# Patient Record
Sex: Female | Born: 1990 | Race: Black or African American | Hispanic: No | Marital: Single | Smoking: Never smoker
Health system: Southern US, Community
[De-identification: ages and names within clinical notes are randomized; demographics above are authoritative.]

---

## 2018-07-15 ENCOUNTER — Emergency Department
Admission: EM | Admit: 2018-07-15 | Discharge: 2018-07-15 | Disposition: A | Discharge status: Home / Self Care | Payer: BC Managed Care – PPO | Attending: Emergency Medicine | Admitting: Emergency Medicine

## 2018-07-15 ENCOUNTER — Emergency Department: Payer: BC Managed Care – PPO

## 2018-07-15 ENCOUNTER — Other Ambulatory Visit: Payer: Self-pay

## 2018-07-15 DIAGNOSIS — W108XXA Fall (on) (from) other stairs and steps, initial encounter: Secondary | ICD-10-CM | POA: Insufficient documentation

## 2018-07-15 DIAGNOSIS — Y929 Unspecified place or not applicable: Secondary | ICD-10-CM | POA: Diagnosis not present

## 2018-07-15 DIAGNOSIS — Y999 Unspecified external cause status: Secondary | ICD-10-CM | POA: Insufficient documentation

## 2018-07-15 DIAGNOSIS — S20222A Contusion of left back wall of thorax, initial encounter: Secondary | ICD-10-CM | POA: Diagnosis not present

## 2018-07-15 DIAGNOSIS — W19XXXA Unspecified fall, initial encounter: Secondary | ICD-10-CM

## 2018-07-15 DIAGNOSIS — Y939 Activity, unspecified: Secondary | ICD-10-CM | POA: Diagnosis not present

## 2018-07-15 DIAGNOSIS — S3992XA Unspecified injury of lower back, initial encounter: Secondary | ICD-10-CM | POA: Diagnosis present

## 2018-07-15 MED ORDER — LIDOCAINE 5 % EX PTCH
1.0000 | MEDICATED_PATCH | CUTANEOUS | Status: DC
  Administered 2018-07-15: 1 via TRANSDERMAL
  Filled 2018-07-15: qty 1

## 2018-07-15 MED ORDER — KETOROLAC TROMETHAMINE 60 MG/2ML IM SOLN
60.0000 mg | Freq: Once | INTRAMUSCULAR | Status: AC
  Administered 2018-07-15: 60 mg via INTRAMUSCULAR
  Filled 2018-07-15: qty 2

## 2018-07-15 MED ORDER — IBUPROFEN 600 MG PO TABS: 600.0000 mg | ORAL_TABLET | Freq: Three times a day (TID) | ORAL | 0 refills | Status: DC | PRN

## 2018-07-15 MED ORDER — CYCLOBENZAPRINE HCL 10 MG PO TABS: 10.0000 mg | ORAL_TABLET | Freq: Three times a day (TID) | ORAL | 0 refills | Status: DC | PRN

## 2018-07-15 MED ORDER — TRAMADOL HCL 50 MG PO TABS: 50.0000 mg | ORAL_TABLET | Freq: Four times a day (QID) | ORAL | 0 refills | Status: AC | PRN

## 2018-07-15 MED ORDER — ORPHENADRINE CITRATE 30 MG/ML IJ SOLN
60.0000 mg | Freq: Two times a day (BID) | INTRAMUSCULAR | Status: DC
  Administered 2018-07-15: 60 mg via INTRAMUSCULAR
  Filled 2018-07-15: qty 2

## 2018-07-15 NOTE — ED Triage Notes (Signed)
Pt states she fell backward down 3 wooden steps this morning and is having pain in the left lower back, denies any other injures at this time.

## 2018-07-15 NOTE — ED Notes (Signed)
See triage note  States she slipped down steps this am  Hitting lower back  Having pain to left lower back

## 2018-07-15 NOTE — Discharge Instructions (Signed)
Discharge care instruction take medication as directed. 

## 2018-07-15 NOTE — ED Provider Notes (Signed)
[facility] Emergency Department Provider Note   ____________________________________________   First MD Initiated Contact with Patient 07/15/18 1052     (approximate)  I have reviewed the triage vital signs and the nursing notes.   HISTORY  Chief Complaint Fall    HPI Ashley Stokes is a 28 y.o. female patient complain of low back pain secondary to falling backwards down 3 wooden steps this morning.  Patient denies radicular component to her l back pain.  Patient denies bladder or bowel dysfunction.  Patient rates the pain as a 6/10.  Patient described pain is "achy".  No palliative measure for complaint.         History reviewed. No pertinent past medical history.  There are no active problems to display for this patient.   History reviewed. No pertinent surgical history.  Prior to Admission medications   Medication Sig Start Date End Date Taking? Authorizing Provider  cyclobenzaprine (FLEXERIL) 10 MG tablet Take 1 tablet (10 mg total) by mouth 3 (three) times daily as needed. 07/15/18   Sable Feil, PA-C  ibuprofen (ADVIL) 600 MG tablet Take 1 tablet (600 mg total) by mouth every 8 (eight) hours as needed. 07/15/18   Sable Feil, PA-C  traMADol (ULTRAM) 50 MG tablet Take 1 tablet (50 mg total) by mouth every 6 (six) hours as needed for up to 3 days. 07/15/18 07/18/18  Sable Feil, PA-C    Allergies Patient has no known allergies.  No family history on file.  Social History Social History   Tobacco Use  . Smoking status: Never Smoker  . Smokeless tobacco: Never Used  Substance Use Topics  . Alcohol use: Yes  . Drug use: Not Currently    Review of Systems Constitutional: No fever/chills Eyes: No visual changes. ENT: No sore throat. Cardiovascular: Denies chest pain. Respiratory: Denies shortness of breath. Gastrointestinal: No abdominal pain.  No nausea, no vomiting.  No diarrhea.  No constipation. Genitourinary: Negative  for dysuria. Musculoskeletal: As of her back pain. Skin: Negative for rash. Neurological: Negative for headaches, focal weakness or numbness.   ____________________________________________   PHYSICAL EXAM:  VITAL SIGNS: ED Triage Vitals  Enc Vitals Group     BP 07/15/18 1048 (!) 138/91     Pulse Rate 07/15/18 1048 91     Resp 07/15/18 1048 17     Temp 07/15/18 1048 98 F (36.7 C)     Temp Source 07/15/18 1048 Oral     SpO2 07/15/18 1048 98 %     Weight 07/15/18 1049 180 lb (81.6 kg)     Height 07/15/18 1049 5\' 3"  (1.6 m)     Head Circumference --      Peak Flow --      Pain Score 07/15/18 1049 6     Pain Loc --      Pain Edu? --      Excl. in [place]? --    Constitutional: Alert and oriented. Well appearing and in no acute distress. Eyes: Conjunctivae are normal. PERRL. EOMI. Head: Atraumatic. Nose: No congestion/rhinnorhea. Mouth/Throat: Mucous membranes are moist.  Oropharynx non-erythematous. Neck: No stridor.  No cervical spine tenderness to palpation. Cardiovascular: Normal rate, regular rhythm. Grossly normal heart sounds.  Good peripheral circulation. Respiratory: Normal respiratory effort.  No retractions. Lungs CTAB. Gastrointestinal: Soft and nontender. No distention. No abdominal bruits. No CVA tenderness. Genitourinary: Deferred Musculoskeletal: No obvious deformities lumbar spine.  Patient decreased range of motion with flexion.  Patient has  moderate guarding palpation L4-S1.  No lower extremity tenderness nor edema.  No joint effusions. Neurologic:  Normal speech and language. No gross focal neurologic deficits are appreciated. No gait instability. Skin:  Skin is warm, dry and intact. No rash noted.  No abrasion or ecchymosis. Psychiatric: Mood and affect are normal. Speech and behavior are normal.  ____________________________________________   LABS (all labs ordered are listed, but only abnormal results are displayed)  Labs Reviewed - No data to display  ____________________________________________  EKG   ____________________________________________  RADIOLOGY  ED MD interpretation:    Official radiology report(s): Dg Lumbar Spine 2-3 Views  Result Date: 07/15/2018 CLINICAL DATA:  Low back injury today when the patient slipped on steps. Initial encounter. EXAM: LUMBAR SPINE - 2-3 VIEW COMPARISON:  None. 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 Exact numbering could be determined with evaluation of plain films of thoracic spine. There is no evidence of lumbar spine fracture. Alignment is normal. Intervertebral disc spaces are maintained. IMPRESSION: No acute abnormality. Electronically Signed   By: Drusilla Kannerhomas  Dalessio M.D.   On: 07/15/2018 12:57    ____________________________________________   PROCEDURES  Procedure(s) performed (including Critical Care):  Procedures   ____________________________________________   INITIAL IMPRESSION / ASSESSMENT AND PLAN / ED COURSE  As part of my medical decision making, I reviewed the following data within the electronic MEDICAL RECORD NUMBER         Patient presents with low back pain secondary to fall.  Discussed negative x-ray findings with patient.  Patient given discharge care instructions and advised take medication as directed.  Patient advised return if condition worsens.      ____________________________________________   FINAL CLINICAL IMPRESSION(S) / ED DIAGNOSES  Final diagnoses:  Fall, initial encounter  Back contusion, left, initial encounter     ED Discharge Orders         Ordered    traMADol (ULTRAM) 50 MG tablet  Every 6 hours PRN     07/15/18 1325    cyclobenzaprine (FLEXERIL) 10 MG tablet  3 times daily PRN     07/15/18 1325    ibuprofen (ADVIL) 600 MG tablet  Every 8 hours PRN     07/15/18 1325           Note:  This document was  prepared using Dragon voice recognition software and may include unintentional dictation errors.    Joni ReiningSmith, Mellony Danziger K, PA-C 07/15/18 1329    Shaune PollackIsaacs, [place], MD 07/15/18 1330

## 2020-04-18 IMAGING — CR LUMBAR SPINE - 2-3 VIEW
1 series · 3 of 3 positions shown · non-contrast
Comparison: None.

CLINICAL DATA: Low back injury today when the patient slipped on
steps. Initial encounter.

EXAM:
LUMBAR SPINE - 2-3 VIEW

[Series 1: dg lumbar spine 2-3 views · 0.14mm/px · 3 of 3 slices shown]
[im 1/3]
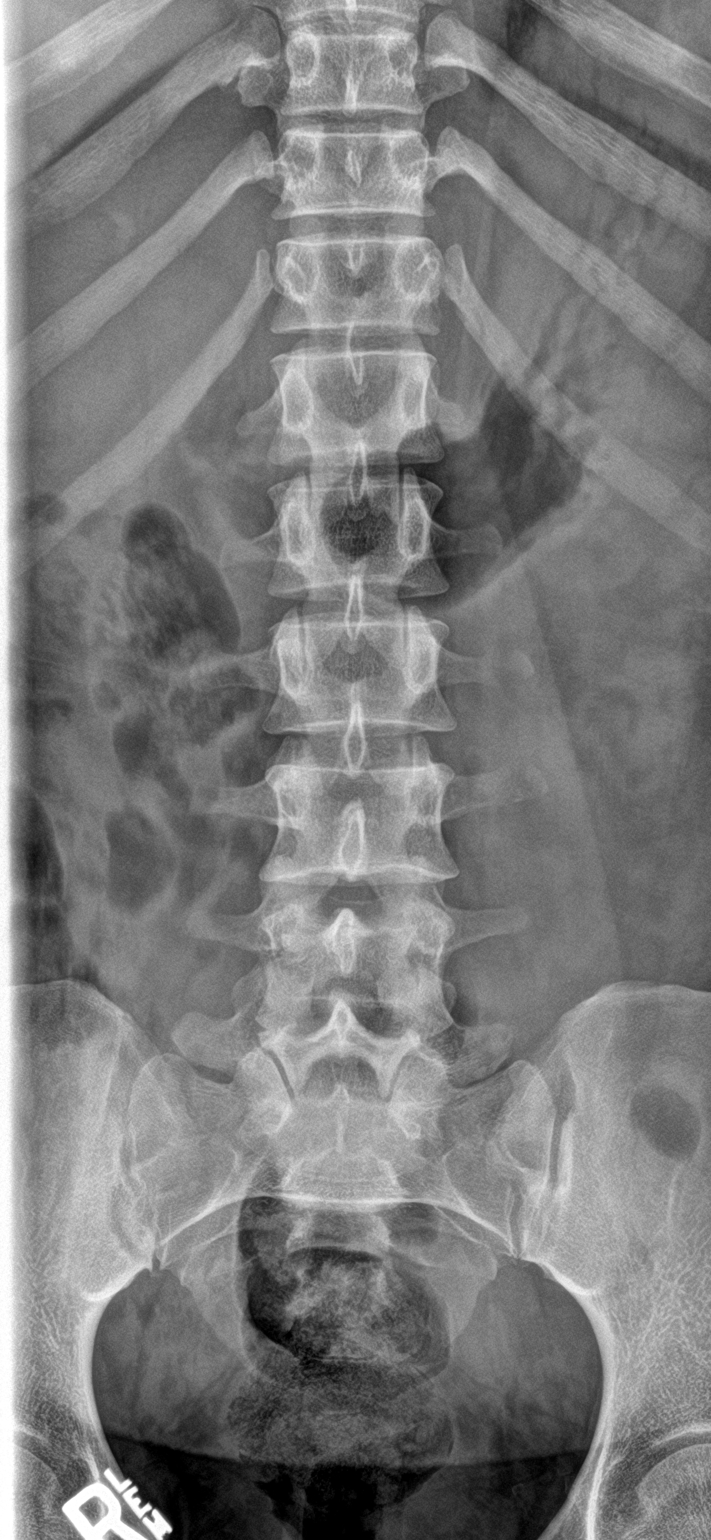
[im 2/3]
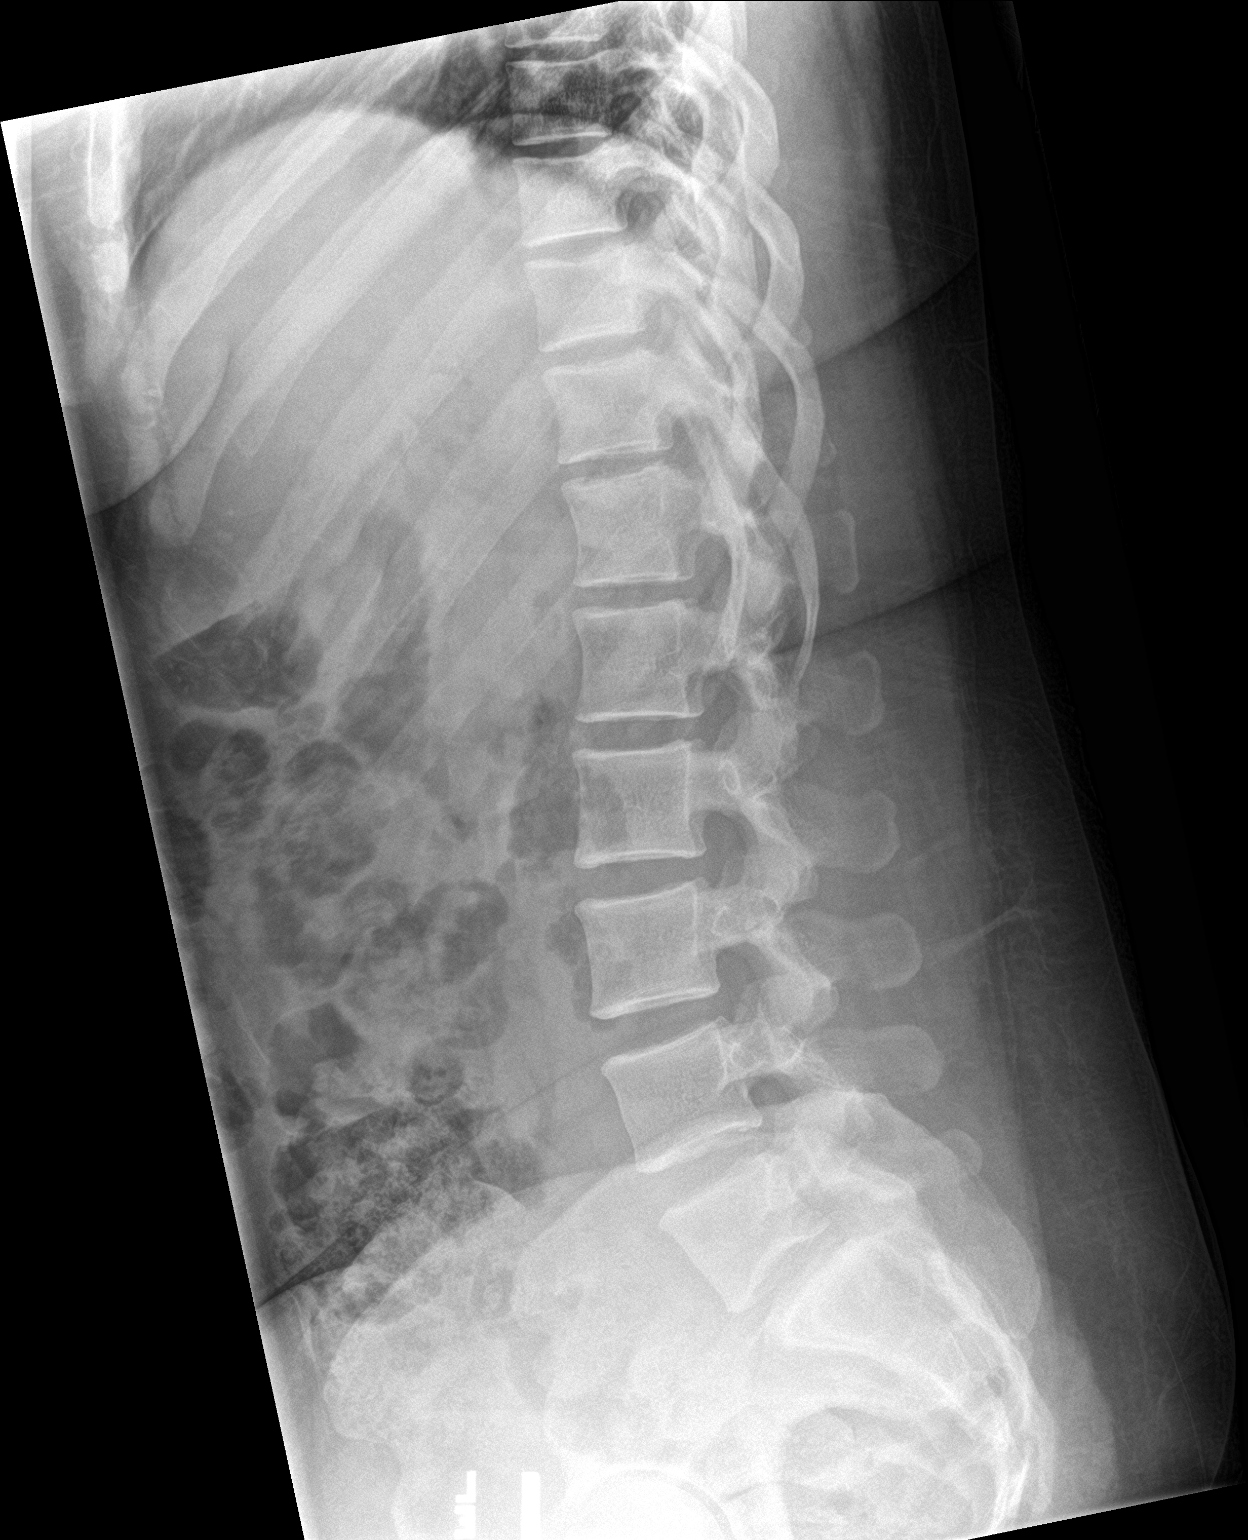
[im 3/3]
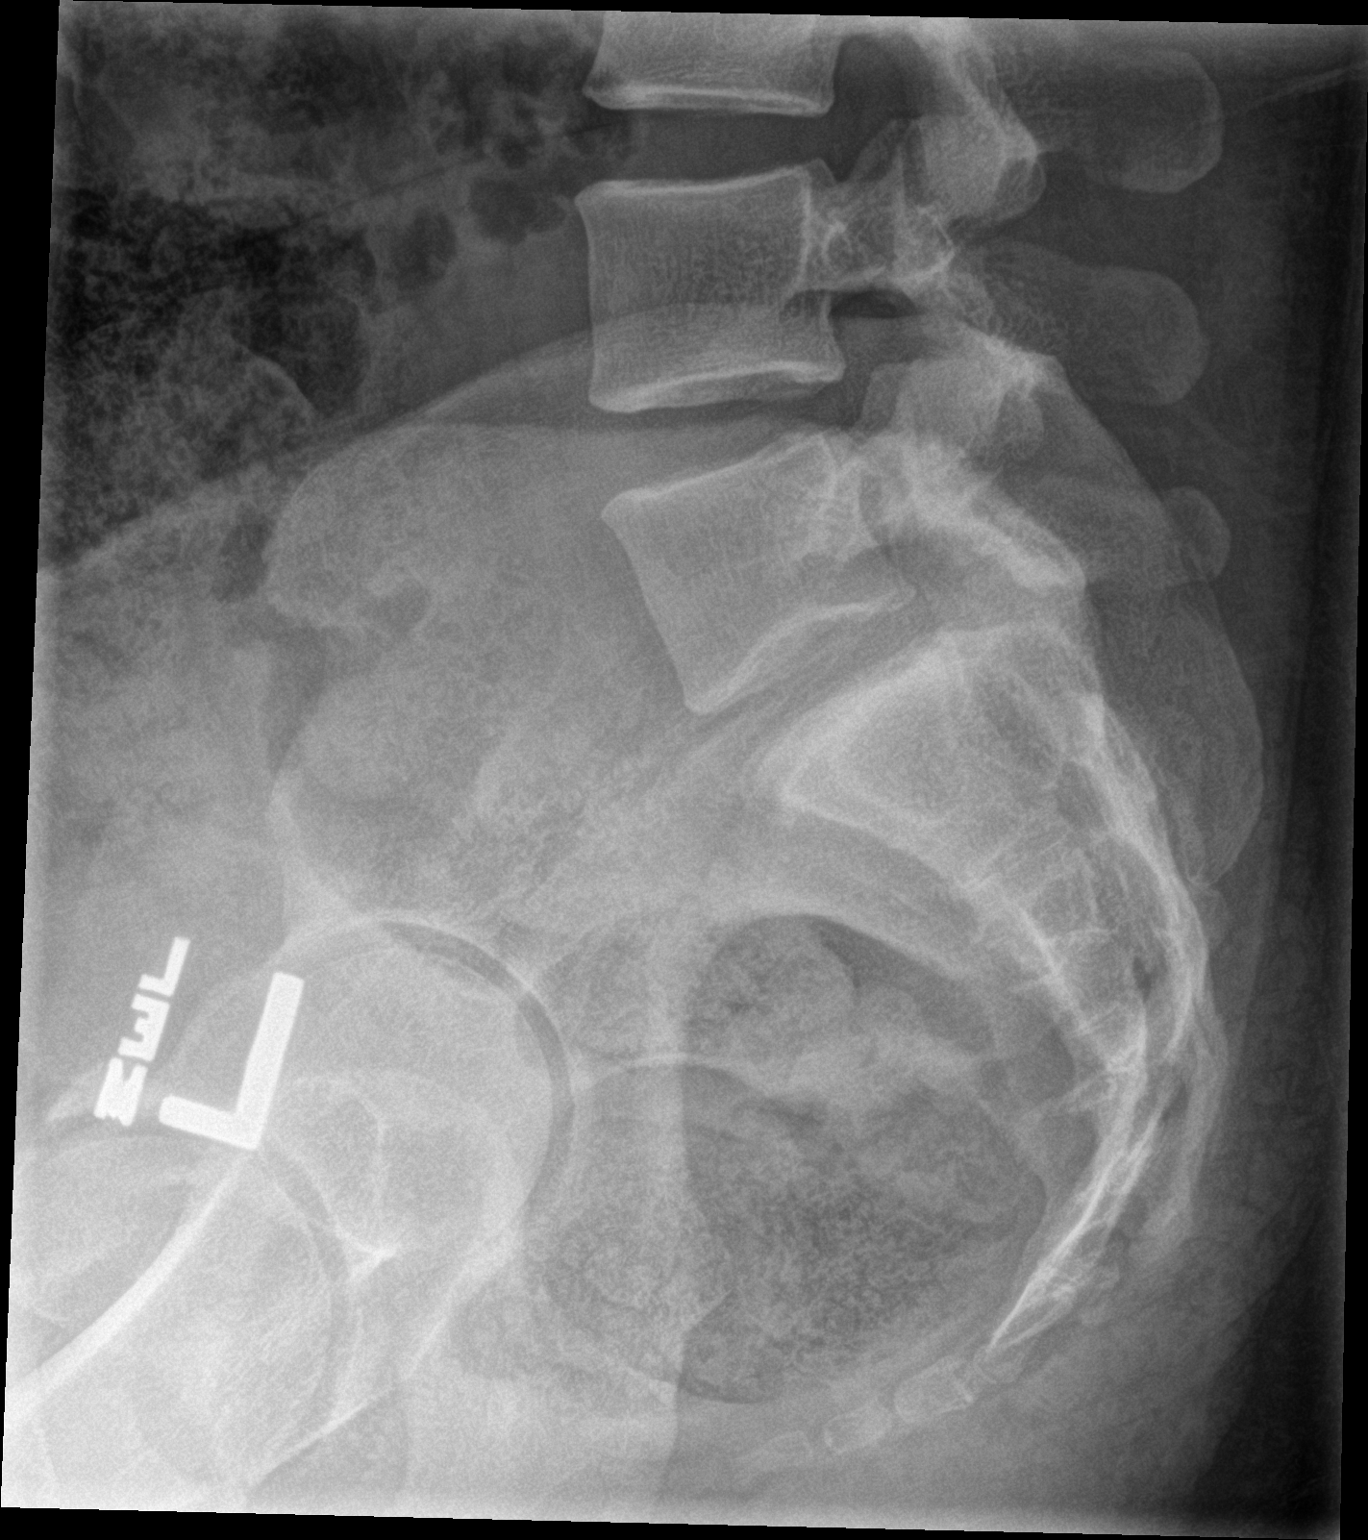

[3 of 3 positions shown; findings below may reference images not displayed]

FINDINGS: The patient has transitional lumbosacral anatomy with 6 lumbar type
vertebral bodies identified. There is likely lumbarization of the
first sacral segment. Exact numbering could be determined with
evaluation of plain films of thoracic spine.

There is no evidence of lumbar spine fracture. Alignment is normal.
Intervertebral disc spaces are maintained.
IMPRESSION: No acute abnormality.

## 2022-06-16 ENCOUNTER — Ambulatory Visit: Payer: BC Managed Care – PPO | Admitting: Podiatry

## 2022-07-20 ENCOUNTER — Telehealth: Payer: Self-pay | Admitting: Physician Assistant

## 2022-07-20 ENCOUNTER — Ambulatory Visit: Payer: Self-pay | Admitting: Physician Assistant

## 2022-07-21 ENCOUNTER — Encounter: Payer: Self-pay | Admitting: Physician Assistant

## 2022-07-21 ENCOUNTER — Ambulatory Visit: Payer: 59 | Admitting: Physician Assistant

## 2022-07-21 VITALS — BP 129/90 | HR 85 | Temp 98.1°F | Ht 63.0 in | Wt 196.3 lb

## 2022-07-21 DIAGNOSIS — E669 Obesity, unspecified: Secondary | ICD-10-CM | POA: Diagnosis not present

## 2022-07-21 DIAGNOSIS — Z7689 Persons encountering health services in other specified circumstances: Secondary | ICD-10-CM

## 2022-07-21 DIAGNOSIS — R030 Elevated blood-pressure reading, without diagnosis of hypertension: Secondary | ICD-10-CM

## 2022-07-21 DIAGNOSIS — E7800 Pure hypercholesterolemia, unspecified: Secondary | ICD-10-CM | POA: Diagnosis not present

## 2022-07-21 NOTE — Progress Notes (Signed)
New patient visit  Patient: Ashley Stokes   DOB: [birth year]   32 y.o. Female  MRN: [id number] Visit Date: 07/21/2022  Today's healthcare provider: Debera Lat, PA-C   Chief Complaint  Patient presents with   New Patient (Initial Visit)   Subjective    Ashley Stokes is a 32 y.o. female who presents today as a new patient to establish care.   HPI   Pt states she is here to establish care. Pt would like to talk about weight loss. Last edited by Daneen Schick, CMA on 07/21/2022  8:48 AM.      Discussed the use of AI scribe software for clinical note transcription with the patient, who gave verbal consent to proceed.  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 She reports difficulty losing weight due to a lack of time for exercise because of her long commute to work. She has tried various methods including intermittent fasting and walking, but has not seen significant results. She expresses interest in weight loss medication and is willing to pay out-of-pocket if necessary. She reports a previous weight of 196 pounds and a goal weight of 165 pounds.  The patient also reports a history of seasonal allergies and is scheduled to see an allergist for the first time. She has no known thyroid issues and denies any symptoms of thyroid dysfunction. She has a family history of diabetes but no personal history of the disease. She also reports a family history of breast cancer.  The patient's blood pressure was slightly elevated during the visit, particularly the diastolic reading. She denies any history of hypertension and is not on any medication for blood pressure. She questions if stress could be a contributing factor.  The patient is due for a physical exam, which she plans to schedule in the next one to three months. She also has an upcoming appointment with her gynecologist  in July.      History reviewed. No pertinent past medical history. History reviewed. No pertinent surgical history. No family status information on file.   History reviewed. No pertinent family history. Social History   Socioeconomic History   Marital status: Single    Spouse name: Not on file   Number of children: Not on file   Years of education: Not on file   Highest education level: Master's degree (e.g., MA, MS, MEng, MEd, MSW, MBA)  Occupational History   Not on file  Tobacco Use   Smoking status: Never   Smokeless tobacco: Never  Substance and Sexual Activity   Alcohol use: Yes   Drug use: Not Currently   Sexual activity: Not on file  Other Topics Concern   Not on file  Social History Narrative   Not on file   Social Determinants of Health   Financial Resource Strain: Low Risk  (07/20/2022)   Overall Financial Resource Strain (CARDIA)    Difficulty of Paying Living Expenses: Not hard at all  Food Insecurity: No Food Insecurity (07/20/2022)   Hunger Vital Sign    Worried About Running Out of Food in the Last Year: Never true    Ran Out of Food in the Last Year: Never true  Transportation Needs: No Transportation Needs (07/20/2022)   PRAPARE - Administrator, Civil Service (Medical): No    Lack of Transportation (Non-Medical): No  Physical Activity: Unknown (07/20/2022)   Exercise  Vital Sign    Days of Exercise per Week: 0 days    Minutes of Exercise per Session: Not on file  Stress: No Stress Concern Present (07/20/2022)   [employer] of Occupational Health - Occupational Stress Questionnaire    Feeling of Stress : Only a little  Social Connections: Unknown (07/20/2022)   Social Connection and Isolation Panel [NHANES]    Frequency of Communication with Friends and Family: More than three times a week    Frequency of Social Gatherings with Friends and Family: Twice a week    Attends Religious Services: More than 4 times per year    Active Member  of [employer] or Organizations: No    Attends Engineer, structural: Not on file    Marital Status: Patient declined   Outpatient Medications Prior to Visit  Medication Sig Note   albuterol (VENTOLIN HFA) 108 (90 Base) MCG/ACT inhaler Inhale into the lungs as needed.    doxycycline (VIBRAMYCIN) 100 MG capsule Take 100 mg by mouth 2 (two) times daily.    ibuprofen (ADVIL) 600 MG tablet Take 1 tablet (600 mg total) by mouth every 8 (eight) hours as needed. 07/21/2022: As needed   Misc Natural Products (ELDERBERRY ZINC/VIT C/IMMUNE MT) Use as directed in the mouth or throat once.    cyclobenzaprine (FLEXERIL) 10 MG tablet Take 1 tablet (10 mg total) by mouth 3 (three) times daily as needed. (Patient not taking: Reported on 07/21/2022)    No facility-administered medications prior to visit.   No Known Allergies  Immunization History  Administered Date(s) Administered   DTaP 11/01/1993, 04/25/1994, 10/26/1994, 05/25/1995, 08/24/1997   Hepatitis A, Ped/Adol-2 Dose 08/10/2010   Hepatitis B, PED/ADOLESCENT 11/01/1993, 04/25/1994, 08/08/2010   Influenza,inj,Quad PF,6+ Mos 12/30/2019   MMR 11/01/1993, 04/25/1994   Meningococcal Conjugate 08/08/2010   PFIZER(Purple Top)SARS-COV-2 Vaccination 06/14/2019, 07/05/2019   Polio, Unspecified 11/01/1993, 04/25/1994, 10/26/1994, 08/24/1997   Td 09/07/2004, 08/08/2010, 04/19/2022   Tdap 12/30/2019, 04/19/2022    Health Maintenance  Topic Date Due   HIV Screening  Never done   Hepatitis C Screening  Never done   PAP-Cervical Cytology Screening  Never done   PAP SMEAR-Modifier  Never done   COVID-19 Vaccine (3 - 2023-24 season) 09/23/2021   INFLUENZA VACCINE  08/24/2022   DTaP/Tdap/Td (11 - Td or Tdap) 04/18/2032   HPV VACCINES  Aged Out    Patient Care Team: Debera Lat, PA-C as PCP - General (Physician Assistant)  Review of Systems  All other systems reviewed and are negative.  Except see HPI      Objective    BP (!) 129/90    Pulse 85   Temp 98.1 F (36.7 C) (Oral)   Ht 5\' 3"  (1.6 m)   Wt 196 lb 4.8 oz (89 kg)   LMP 07/17/2022 (Exact Date)   SpO2 99%   BMI 34.77 kg/m    Physical Exam Vitals reviewed.  Constitutional:      General: She is not in acute distress.    Appearance: Normal appearance. She is well-developed. She is obese. She is not diaphoretic.  HENT:     Head: Normocephalic and atraumatic.  Eyes:     General: No scleral icterus.    Conjunctiva/sclera: Conjunctivae normal.  Neck:     Thyroid: No thyromegaly.  Cardiovascular:     Rate and Rhythm: Normal rate and regular rhythm.     Pulses: Normal pulses.     Heart sounds: Normal heart sounds. No murmur heard. Pulmonary:  Effort: Pulmonary effort is normal. No respiratory distress.     Breath sounds: Normal breath sounds. No wheezing, rhonchi or rales.  Musculoskeletal:     Cervical back: Neck supple.     Right lower leg: No edema.     Left lower leg: No edema.  Lymphadenopathy:     Cervical: No cervical adenopathy.  Skin:    General: Skin is warm and dry.     Findings: No rash.  Neurological:     Mental Status: She is alert and oriented to person, place, and time. Mental status is at baseline.  Psychiatric:        Mood and Affect: Mood normal.        Behavior: Behavior normal.     Depression Screen    07/21/2022    8:55 AM  PHQ 2/9 Scores  PHQ - 2 Score 0  PHQ- 9 Score 0   No results found for any visits on 07/21/22.  Assessment & Plan      Elevated Blood pressure Elevated blood pressure noted during visit. No current treatment. Possible deconditioning due to lack of exercise. -Obtain previous medical records to assess past blood pressure readings and any previous management. -Consider lifestyle modifications and potential pharmacological treatment based on further assessment. -Encouraged stay active   Obesity Weight Management: Patient expresses desire for weight loss and struggles with time for exercise due to  work schedule. Current weight 196 lbs, goal weight 165 lbs. -Refer to medical weight loss program for comprehensive assessment and management. -Obtain previous lab results to assess for any metabolic conditions that may impact weight management.  Seasonal Allergies: Patient has upcoming appointment with allergist. -Obtain records from allergist following appointment for continuity of care. - Use nasal saline rinses before nose sprays such as with Neilmed Sinus Rinse bottle.  Use distilled water.   - Use OTC Flonase 2 sprays each nostril daily. Aim upward and outward. - Use OTC Zyrtec 10 mg daily.    Preventive Health: Patient has upcoming appointment with gynecologist in July. -Obtain records from gynecologist following appointment for continuity of care. -Schedule physical exam in 1-3 months.  Family History: Noted family history of diabetes and breast cancer. -Consider appropriate screenings based on family history and current guidelines.   Advised to send her most recent labs to us/ from 2 mo ago.  Encounter to establish care Welcomed to our clinic Reviewed past medical hx, social hx, family hx and surgical hx Pt advised to send all vaccination records or screening   Return in about 3 months (around 10/21/2022) for CPE.    The patient was advised to call back or seek an in-person evaluation if the symptoms worsen or if the condition fails to improve as anticipated.  I discussed the assessment and treatment plan with the patient. The patient was provided an opportunity to ask questions and all were answered. The patient agreed with the plan and demonstrated an understanding of the instructions.  I, Debera Lat, PA-C have reviewed all documentation for this visit. The documentation on 07/21/2022 for the exam, diagnosis, procedures, and orders are all accurate and complete.  Debera Lat, [facility], MMS [facility] [phone] (phone) [phone] (fax)  [facility]

## 2022-08-07 ENCOUNTER — Ambulatory Visit: Payer: 59 | Admitting: Family Medicine

## 2022-08-07 ENCOUNTER — Encounter: Payer: Self-pay | Admitting: Family Medicine

## 2022-08-07 VITALS — BP 122/79 | HR 92 | Temp 97.7°F | Resp 16 | Ht 63.0 in | Wt 201.0 lb

## 2022-08-07 DIAGNOSIS — R0981 Nasal congestion: Secondary | ICD-10-CM

## 2022-08-07 DIAGNOSIS — K0889 Other specified disorders of teeth and supporting structures: Secondary | ICD-10-CM

## 2022-08-07 MED ORDER — AZITHROMYCIN 250 MG PO TABS: ORAL_TABLET | ORAL | 0 refills | Status: AC

## 2022-08-07 NOTE — Progress Notes (Signed)
Established patient visit   Patient: Ashley Stokes   DOB: 11-20-1990   32 y.o. Female  MRN: [id number] Visit Date: 08/07/2022  Today's healthcare provider: Mila Merry, MD   Chief Complaint  Patient presents with   URI   Subjective    Discussed the use of AI scribe software for clinical note transcription with the patient, who gave verbal consent to proceed.  History of Present Illness   The patient presents with a chief complaint of persistent headaches and toothaches, which began approximately a week ago. The headaches were initially experienced in the morning, but have since become more constant. It has been associated with aching in the back teeth, despite a recent dental check-up that revealed no issues. The dentist suggested the symptoms might be due to a sinus infection and recommended seeking medical attention.  The patient attempted self-treatment with over-the-counter oral gel, which seemed to alleviate symptoms temporarily. However, the headaches and toothaches returned, particularly on the right side of the face and near the ear. The patient denies any ear fullness or clogging, and no tenderness was reported when pressure was applied to the sinus areas. There were no reported issues with nasal breathing or stuffiness, only headaches and oral discomfort.  The patient's symptoms have not been severe enough to prevent daily activities, but she expresses concern about the discomfort, particularly with an upcoming birthday. The patient's sleep has been affected, but it is unclear whether this is due to the symptoms or other factors. [facility] for prescriptions.       Medications: Outpatient Medications Prior to Visit  Medication Sig   doxycycline (VIBRAMYCIN) 100 MG capsule Take 100 mg by mouth 2 (two) times daily.   [DISCONTINUED] albuterol (VENTOLIN HFA) 108 (90 Base) MCG/ACT inhaler Inhale into the lungs as needed.   [DISCONTINUED]  cyclobenzaprine (FLEXERIL) 10 MG tablet Take 1 tablet (10 mg total) by mouth 3 (three) times daily as needed. (Patient not taking: Reported on 07/21/2022)   [DISCONTINUED] ibuprofen (ADVIL) 600 MG tablet Take 1 tablet (600 mg total) by mouth every 8 (eight) hours as needed.   [DISCONTINUED] Misc Natural Products (ELDERBERRY ZINC/VIT C/IMMUNE MT) Use as directed in the mouth or throat once.   No facility-administered medications prior to visit.   Review of Systems       Objective    BP 122/79 (BP Location: Left Arm, Patient Position: Sitting, Cuff Size: Large)   Pulse 92   Temp 97.7 F (36.5 C) (Temporal)   Resp 16   Ht 5\' 3"  (1.6 m)   Wt 201 lb (91.2 kg)   LMP 07/17/2022 (Exact Date)   SpO2 99%   BMI 35.61 kg/m      Physical Exam   General Appearance:    Mildly obese female, alert, cooperative, in no acute distress  HENT:   bilateral TM normal without fluid or infection, neck without nodes, throat normal without erythema or exudate, post nasal drip noted, and nasal mucosa congested  Eyes:    PERRL, conjunctiva/corneas clear, EOM's intact       Lungs:     Clear to auscultation bilaterally, respirations unlabored  Heart:    Normal heart rate. Normal rhythm. No murmurs, rubs, or gallops.    Neurologic:   Awake, alert, oriented x 3. No apparent focal neurological           defect.         Assessment & Plan  Assessment and Plan    Sinusitis: Patient presents with headache and toothache, which are suggestive of sinusitis. No signs of nasal congestion or difficulty breathing. Dentist ruled out dental issues. -Prescribe Zithromax (Z-Pak) and [facility]. -Advise patient to take ibuprofen for inflammation and pain relief.           Mila Merry, MD  [facility] [phone] (phone) [phone] (fax)  [facility]

## 2022-08-25 ENCOUNTER — Ambulatory Visit: Payer: Self-pay | Admitting: Dietician

## 2022-09-22 ENCOUNTER — Encounter: Payer: 59 | Admitting: Physician Assistant

## 2022-10-13 ENCOUNTER — Encounter: Payer: 59 | Admitting: Family Medicine

## 2022-10-13 DIAGNOSIS — Z0000 Encounter for general adult medical examination without abnormal findings: Secondary | ICD-10-CM | POA: Insufficient documentation

## 2022-10-13 NOTE — Progress Notes (Deleted)
Complete physical exam   Patient: Ashley Stokes   DOB: Mar 16, 1990   32 y.o. Female  MRN: [id number] Visit Date: 10/13/2022  Today's healthcare provider: Ronnald Ramp, MD   No chief complaint on file.  Subjective    Ashley Stokes is a 32 y.o. female who presents today for a complete physical exam.   She reports consuming a {diet types:17450} diet.   {Exercise:19826}   She generally feels {well/fairly well/poorly:18703}.   She reports sleeping {well/fairly well/poorly:18703}.    She {does/does not:200015} have additional problems to discuss today.   Discussed the use of AI scribe software for clinical note transcription with the patient, who gave verbal consent to proceed.  History of Present Illness             No past medical history on file. No past surgical history on file. Social History   Socioeconomic History   Marital status: Single    Spouse name: Not on file   Number of children: Not on file   Years of education: Not on file   Highest education level: Master's degree (e.g., MA, MS, MEng, MEd, MSW, MBA)  Occupational History   Not on file  Tobacco Use   Smoking status: Never   Smokeless tobacco: Never  Substance and Sexual Activity   Alcohol use: Yes   Drug use: Not Currently   Sexual activity: Not on file  Other Topics Concern   Not on file  Social History Narrative   Not on file   Social Determinants of Health   Financial Resource Strain: Low Risk  (09/15/2022)   Received from [facility]   Overall Financial Resource Strain (CARDIA)    Difficulty of Paying Living Expenses: Not hard at all  Food Insecurity: No Food Insecurity (09/15/2022)   Received from [facility]   Hunger Vital Sign    Worried About Running Out of Food in the Last Year: Never true    Ran Out of Food in the Last Year: Never true  Transportation Needs: No Transportation Needs (09/15/2022)   Received from [facility] - Transportation    In the past 12 months, has lack of transportation kept you from medical appointments or from getting medications?: No    Lack of Transportation (Non-Medical): No  Physical Activity: Inactive (09/15/2022)   Received from [facility]   Exercise Vital Sign    Days of Exercise per Week: 0 days    Minutes of Exercise per Session: 0 min  Stress: No Stress Concern Present (09/15/2022)   Received from [facility] - Occupational Stress Questionnaire    Feeling of Stress : Not at all  Social Connections: Moderately Integrated (09/15/2022)   Received from [facility]   Social Connection and Isolation Panel [NHANES]    Frequency of Communication with Friends and Family: More than three times a week    Frequency of Social Gatherings with Friends and Family: Once a week    Attends Religious Services: 1 to 4 times per year    Active Member of [employer] or Organizations: Yes    Attends Banker Meetings: 1 to 4 times per year    Marital Status: Never married  Intimate Partner Violence: Not At Risk (06/18/2020)   Received from [facility]   Humiliation, Afraid, Rape, and Kick questionnaire    Fear of Current  or Ex-Partner: No    Emotionally Abused: No    Physically Abused: No    Sexually Abused: No   No family status information on file.   No family history on file. No Known Allergies   Medications: Outpatient Medications Prior to Visit  Medication Sig   doxycycline (VIBRAMYCIN) 100 MG capsule Take 100 mg by mouth 2 (two) times daily.   No facility-administered medications prior to visit.    Review of Systems  {Insert previous labs (optional):23779} {See past labs  Heme  Chem  Endocrine  Serology  Results Review (optional):1}  Objective    There were no vitals taken for this visit. BP Readings from Last 3 Encounters:   08/07/22 122/79  07/21/22 (!) 129/90  07/15/18 (!) 138/91   Wt Readings from Last 3 Encounters:  08/07/22 201 lb (91.2 kg)  07/21/22 196 lb 4.8 oz (89 kg)  07/15/18 180 lb (81.6 kg)    {See vitals history (optional):1}    Physical Exam  ***  Last depression screening scores    08/07/2022    2:33 PM 07/21/2022    8:55 AM  PHQ 2/9 Scores  PHQ - 2 Score 0 0  PHQ- 9 Score 0 0    Last fall risk screening    08/07/2022    2:33 PM  Fall Risk   Falls in the past year? 0  Number falls in past yr: 0  Injury with Fall? 0  Risk for fall due to : No Fall Risks  Follow up Falls evaluation completed    Last Audit-C alcohol use screening    07/21/2022    8:55 AM  Alcohol Use Disorder Test (AUDIT)  1. How often do you have a drink containing alcohol? 1  2. How many drinks containing alcohol do you have on a typical day when you are drinking? 0  3. How often do you have six or more drinks on one occasion? 0  AUDIT-C Score 1   A score of 3 or more in women, and 4 or more in men indicates increased risk for alcohol abuse, EXCEPT if all of the points are from question 1   No results found for any visits on 10/13/22.  Assessment & Plan    Routine Health Maintenance and Physical Exam  Immunization History  Administered Date(s) Administered   DTaP 11/01/1993, 04/25/1994, 10/26/1994, 05/25/1995, 08/24/1997   Hepatitis A, Ped/Adol-2 Dose 08/10/2010   Hepatitis B, PED/ADOLESCENT 11/01/1993, 04/25/1994, 08/08/2010   Influenza,inj,Quad PF,6+ Mos 12/30/2019   Influenza-Unspecified 09/14/2009   MMR 11/01/1993, 04/25/1994   Meningococcal Conjugate 08/08/2010   PFIZER(Purple Top)SARS-COV-2 Vaccination 06/14/2019, 07/05/2019   Polio, Unspecified 11/01/1993, 04/25/1994, 10/26/1994, 08/24/1997   Td 09/07/2004, 08/08/2010, 04/19/2022   Tdap 12/30/2019, 04/19/2022    Health Maintenance  Topic Date Due   HIV Screening  Never done   Hepatitis C Screening  Never done   INFLUENZA  VACCINE  08/24/2022   COVID-19 Vaccine (3 - 2023-24 season) 09/24/2022   Cervical Cancer Screening (HPV/Pap Cotest)  09/14/2025   DTaP/Tdap/Td (11 - Td or Tdap) 04/18/2032   HPV VACCINES  Aged Out    Problem List Items Addressed This Visit     Annual physical exam - Primary   Elevated blood pressure reading   Elevated cholesterol   Obesity (BMI 30.0-34.9)   Other Visit Diagnoses     Screening for diabetes mellitus       Encounter for hepatitis C screening test for low risk patient  Screening for HIV (human immunodeficiency virus)           Assessment and Plan                No follow-ups on file.       Ronnald Ramp, MD  [facility] [phone] (phone) [phone] (fax)  [facility]
